# Patient Record
Sex: Female | Born: 1960 | Race: Black or African American | Hispanic: No | Marital: Married | Smoking: Former smoker
Health system: Southern US, Community
[De-identification: ages and names within clinical notes are randomized; demographics above are authoritative.]

## PROBLEM LIST (undated history)

## (undated) DIAGNOSIS — E7800 Pure hypercholesterolemia, unspecified: Secondary | ICD-10-CM

## (undated) DIAGNOSIS — E119 Type 2 diabetes mellitus without complications: Secondary | ICD-10-CM

## (undated) DIAGNOSIS — I10 Essential (primary) hypertension: Secondary | ICD-10-CM

## (undated) HISTORY — PX: TUBAL LIGATION: SHX77

## (undated) HISTORY — PX: ELBOW SURGERY: SHX618

## (undated) HISTORY — PX: ABLATION ON ENDOMETRIOSIS: SHX5787

---

## 1998-11-25 ENCOUNTER — Ambulatory Visit (HOSPITAL_BASED_OUTPATIENT_CLINIC_OR_DEPARTMENT_OTHER): Admission: RE | Admit: 1998-11-25 | Discharge: 1998-11-25 | Payer: Self-pay | Admitting: Orthopedic Surgery

## 2000-11-23 ENCOUNTER — Ambulatory Visit (HOSPITAL_BASED_OUTPATIENT_CLINIC_OR_DEPARTMENT_OTHER): Admission: RE | Admit: 2000-11-23 | Discharge: 2000-11-23 | Payer: Self-pay | Admitting: Orthopedic Surgery

## 2002-12-04 ENCOUNTER — Encounter: Payer: Self-pay | Admitting: Emergency Medicine

## 2002-12-04 ENCOUNTER — Emergency Department (HOSPITAL_COMMUNITY): Admission: EM | Admit: 2002-12-04 | Discharge: 2002-12-04 | Payer: Self-pay

## 2008-02-04 ENCOUNTER — Emergency Department (HOSPITAL_BASED_OUTPATIENT_CLINIC_OR_DEPARTMENT_OTHER): Admission: EM | Admit: 2008-02-04 | Discharge: 2008-02-04 | Payer: Self-pay | Admitting: Emergency Medicine

## 2008-02-04 ENCOUNTER — Ambulatory Visit: Payer: Self-pay | Admitting: Diagnostic Radiology

## 2008-04-12 ENCOUNTER — Emergency Department (HOSPITAL_BASED_OUTPATIENT_CLINIC_OR_DEPARTMENT_OTHER): Admission: EM | Admit: 2008-04-12 | Discharge: 2008-04-12 | Payer: Self-pay | Admitting: Emergency Medicine

## 2008-04-12 ENCOUNTER — Ambulatory Visit: Payer: Self-pay | Admitting: Radiology

## 2009-07-13 ENCOUNTER — Emergency Department (HOSPITAL_BASED_OUTPATIENT_CLINIC_OR_DEPARTMENT_OTHER): Admission: EM | Admit: 2009-07-13 | Discharge: 2009-07-13 | Payer: Self-pay | Admitting: Emergency Medicine

## 2009-07-13 ENCOUNTER — Ambulatory Visit: Payer: Self-pay | Admitting: Diagnostic Radiology

## 2010-05-17 LAB — BASIC METABOLIC PANEL
BUN: 14 mg/dL (ref 6–23)
CO2: 29 mEq/L (ref 19–32)
Chloride: 99 mEq/L (ref 96–112)
Creatinine, Ser: 0.8 mg/dL (ref 0.4–1.2)

## 2010-05-17 LAB — DIFFERENTIAL
Basophils Relative: 1 % (ref 0–1)
Eosinophils Absolute: 0 10*3/uL (ref 0.0–0.7)
Neutrophils Relative %: 75 % (ref 43–77)

## 2010-05-17 LAB — D-DIMER, QUANTITATIVE: D-Dimer, Quant: 0.36 ug/mL-FEU (ref 0.00–0.48)

## 2010-05-17 LAB — CBC
MCHC: 32.2 g/dL (ref 30.0–36.0)
MCV: 80.5 fL (ref 78.0–100.0)
Platelets: 268 10*3/uL (ref 150–400)

## 2010-05-17 LAB — POCT CARDIAC MARKERS

## 2010-07-16 NOTE — Op Note (Signed)
[place]. [facility]  Patient:    Gwendolyn Wilson, Gwendolyn Wilson Visit Number: [id number] MRN: [id number]          Service Type: DSU Location: [facility] Attending Physician:  Ronne Binning Dictated by:   Nicki Reaper, M.D. Proc. Date: 11/23/00 Admit Date:  11/23/2000                             Operative Report  PREOPERATIVE DIAGNOSIS: Stenosing tenosynovitis, right middle finger.  POSTOPERATIVE DIAGNOSIS: Stenosing tenosynovitis, right middle finger.  OPERATION/PROCEDURE: Release of A1 pulley, right middle finger.  SURGEON: Nicki Reaper, M.D.  ASSISTANT: Joaquin Courts, R.N.  ANESTHESIA: Forearm-based IV regional.  ANESTHESIOLOGIST: Halford Decamp, M.D.  INDICATIONS FOR PROCEDURE: The patient is a 50 year old female, with a history of triggering of her right middle finger, not responsive to conservative treatment.  DESCRIPTION OF PROCEDURE: The patient was brought to the operating room, where a forearm-based IV regional anesthetic was carried out without difficulty. She was prepped and draped using Betadine scrubbing solution with the right arm free.  An oblique incision was made over the metacarpophalangeal joint of the right middle finger and carried down through the subcutaneous tissue. Bleeders were electrocauterized.  The fascia was split and the A1 pulley was identified, with protection to the neurovascular structures.  The A1 pulley was then incised on its radial aspect.  This was found to be markedly thickened.  An incision was made in the very beginning of the A2 pulley and the finger placed through full range of motion, and no further triggering was identified.  The wound was irrigated.  The skin was closed with interrupted 5-0 nylon sutures.  A sterile compressive dressing was applied.  The patient tolerated the procedure well and was taken to the recovery room for observation in satisfactory condition.  She is discharged home to return to the  [facility] of [place] in one week, on Vicodin and Keflex.Dictated by: Nicki Reaper, M.D. Attending Physician:  Ronne Binning DD:  11/23/00 TD:  11/23/00 Job: [id number]

## 2016-04-15 ENCOUNTER — Emergency Department (HOSPITAL_BASED_OUTPATIENT_CLINIC_OR_DEPARTMENT_OTHER)
Admission: EM | Admit: 2016-04-15 | Discharge: 2016-04-15 | Disposition: A | Discharge status: Home / Self Care | Payer: No Typology Code available for payment source | Attending: Emergency Medicine | Admitting: Emergency Medicine

## 2016-04-15 ENCOUNTER — Emergency Department (HOSPITAL_BASED_OUTPATIENT_CLINIC_OR_DEPARTMENT_OTHER): Payer: No Typology Code available for payment source

## 2016-04-15 ENCOUNTER — Encounter (HOSPITAL_BASED_OUTPATIENT_CLINIC_OR_DEPARTMENT_OTHER): Payer: Self-pay

## 2016-04-15 DIAGNOSIS — Z87891 Personal history of nicotine dependence: Secondary | ICD-10-CM | POA: Insufficient documentation

## 2016-04-15 DIAGNOSIS — R0789 Other chest pain: Secondary | ICD-10-CM | POA: Insufficient documentation

## 2016-04-15 DIAGNOSIS — R079 Chest pain, unspecified: Secondary | ICD-10-CM | POA: Diagnosis present

## 2016-04-15 DIAGNOSIS — I10 Essential (primary) hypertension: Secondary | ICD-10-CM | POA: Diagnosis not present

## 2016-04-15 DIAGNOSIS — R062 Wheezing: Secondary | ICD-10-CM | POA: Diagnosis not present

## 2016-04-15 DIAGNOSIS — E119 Type 2 diabetes mellitus without complications: Secondary | ICD-10-CM | POA: Diagnosis not present

## 2016-04-15 HISTORY — DX: Pure hypercholesterolemia, unspecified: E78.00

## 2016-04-15 HISTORY — DX: Type 2 diabetes mellitus without complications: E11.9

## 2016-04-15 HISTORY — DX: Essential (primary) hypertension: I10

## 2016-04-15 LAB — CBC WITH DIFFERENTIAL/PLATELET
Basophils Absolute: 0 10*3/uL (ref 0.0–0.1)
Basophils Relative: 0 %
Eosinophils Absolute: 0.1 10*3/uL (ref 0.0–0.7)
Eosinophils Relative: 2 %
HEMATOCRIT: 38.2 % (ref 36.0–46.0)
HEMOGLOBIN: 12.1 g/dL (ref 12.0–15.0)
LYMPHS ABS: 1.8 10*3/uL (ref 0.7–4.0)
LYMPHS PCT: 26 %
MCH: 25.4 pg — AB (ref 26.0–34.0)
MCHC: 31.7 g/dL (ref 30.0–36.0)
MCV: 80.1 fL (ref 78.0–100.0)
Monocytes Absolute: 0.4 10*3/uL (ref 0.1–1.0)
Monocytes Relative: 6 %
NEUTROS PCT: 66 %
Neutro Abs: 4.4 10*3/uL (ref 1.7–7.7)
Platelets: 242 10*3/uL (ref 150–400)
RBC: 4.77 MIL/uL (ref 3.87–5.11)
RDW: 15.2 % (ref 11.5–15.5)
WBC: 6.7 10*3/uL (ref 4.0–10.5)

## 2016-04-15 LAB — COMPREHENSIVE METABOLIC PANEL
ALBUMIN: 3.7 g/dL (ref 3.5–5.0)
ALT: 18 U/L (ref 14–54)
ANION GAP: 8 (ref 5–15)
AST: 23 U/L (ref 15–41)
Alkaline Phosphatase: 103 U/L (ref 38–126)
BUN: 9 mg/dL (ref 6–20)
CHLORIDE: 101 mmol/L (ref 101–111)
CO2: 28 mmol/L (ref 22–32)
Calcium: 9.5 mg/dL (ref 8.9–10.3)
Creatinine, Ser: 0.69 mg/dL (ref 0.44–1.00)
GFR calc non Af Amer: >60 mL/min (ref >60)
Glucose, Bld: 171 mg/dL — ABNORMAL HIGH (ref 65–99)
Potassium: 2.9 mmol/L — ABNORMAL LOW (ref 3.5–5.1)
SODIUM: 137 mmol/L (ref 135–145)
Total Bilirubin: 0.5 mg/dL (ref 0.3–1.2)
Total Protein: 7.6 g/dL (ref 6.5–8.1)

## 2016-04-15 LAB — TROPONIN I: Troponin I: <0.03 ng/mL (ref <0.03)

## 2016-04-15 MED ORDER — KETOROLAC TROMETHAMINE 30 MG/ML IJ SOLN
30.0000 mg | Freq: Once | INTRAMUSCULAR | Status: AC
  Administered 2016-04-15: 30 mg via INTRAVENOUS
  Filled 2016-04-15: qty 1

## 2016-04-15 NOTE — Discharge Instructions (Signed)
Ibuprofen 600 mg every 6 hours as needed for pain.  Follow-up with cardiology next week. Call Monday to make arrangements for a follow-up appointment. [facility] has been provided in this discharge summary for you to make these arrangements.  Return to the ER in the meantime if your symptoms significantly worsen or change.

## 2016-04-15 NOTE — ED Provider Notes (Signed)
[facility] Provider Note   CSN: [id number] Arrival date & time: 04/15/16  2045  By signing my name below, I, [name], attest that this documentation has been prepared under the direction and in the presence of Geoffery Lyonsouglas Jahmia Berrett, MD. Electronically Signed: [name], Scribe. 04/15/2016. 9:01 PM.  History   Chief Complaint Chief Complaint  Patient presents with  . Chest Pain   The history is provided by the patient. No language interpreter was used.  Chest Pain   This is a new problem. The current episode started more than 2 days ago. The problem occurs constantly. The problem has not changed since onset.Associated symptoms include diaphoresis. Pertinent negatives include no nausea.   HPI Comments: Gwendolyn DadeClaudia Wilson is a 56 y.o. female with a PMHx of DM, hyperlipidemia, and HTN who presents to the Emergency Department complaining of intermittent moderate chest pain that started about 3 days ago. She states was having URI-like symptoms 2 weeks ago and she had mild chest pain then. Her pain returned and worsened today. She tried using her albuterol inhaler today without any relief. No exacerbating factors. She describes the pain as a pressure sensation. She reports associated wheezing and diaphoresis. She has a family hx of cardiac issues. She denies any hx of smoking, nausea, or other complaints.   Past Medical History:  Diagnosis Date  . Diabetes mellitus without complication (HCC)   . High cholesterol   . Hypertension     There are no active problems to display for this patient.   Past Surgical History:  Procedure Laterality Date  . ABLATION ON ENDOMETRIOSIS    . ELBOW SURGERY    . TUBAL LIGATION      OB History    No data available       Home Medications    Prior to Admission medications   Not on File    Family History No family history on file.  Social History Social History  Substance Use Topics  . Smoking status: Former Games developermoker  . Smokeless  tobacco: Never Used  . Alcohol use No     Allergies   Patient has no known allergies.   Review of Systems Review of Systems  Constitutional: Positive for diaphoresis.  Respiratory: Positive for wheezing.   Cardiovascular: Positive for chest pain.  Gastrointestinal: Negative for nausea.  All other systems reviewed and are negative.    Physical Exam Updated Vital Signs BP 151/93 (BP Location: Left Arm)   Pulse 80   Temp 98.1 F (36.7 C) (Oral)   Resp 18   Ht 5\' 10"  (1.778 m)   Wt 279 lb (126.6 kg)   SpO2 99%   BMI 40.03 kg/m   Physical Exam  Constitutional: She is oriented to person, place, and time. She appears well-developed and well-nourished. No distress.  HENT:  Head: Normocephalic and atraumatic.  Eyes: EOM are normal.  Neck: Normal range of motion.  Cardiovascular: Normal rate, regular rhythm and normal heart sounds.   Pulmonary/Chest: Effort normal and breath sounds normal.  Abdominal: Soft. She exhibits no distension. There is no tenderness.  Musculoskeletal: Normal range of motion.  Neurological: She is alert and oriented to person, place, and time.  Skin: Skin is warm and dry.  Psychiatric: She has a normal mood and affect. Judgment normal.  Nursing note and vitals reviewed.    ED Treatments / Results  DIAGNOSTIC STUDIES: Oxygen Saturation is 99% on RA, normal by my interpretation.    COORDINATION OF CARE: 9:05 PM- Pt  advised of plan for treatment and pt agrees.  Labs (all labs ordered are listed, but only abnormal results are displayed) Labs Reviewed  COMPREHENSIVE METABOLIC PANEL  TROPONIN I  CBC WITH DIFFERENTIAL/PLATELET    EKG  EKG Interpretation None       Radiology No results found.  Procedures Procedures (including critical care time)  Medications Ordered in ED Medications  ketorolac (TORADOL) 30 MG/ML injection 30 mg (not administered)     Initial Impression / Assessment and Plan / ED Course  I have reviewed the  triage vital signs and the nursing notes.  Pertinent labs & imaging results that were available during my care of the patient were reviewed by me and considered in my medical decision making (see chart for details).  Patient presents here with complaints of discomfort in her chest. She has recently been treated for bronchitis. Her chest discomfort has been ongoing for 3 days and her troponin is negative and EKG unchanged. I doubt a cardiac etiology. She does have significant risk factors however and I feel as though a follow-up appointment with cardiology as indicated. She will be given the information for the [facility] cardiology clinic and advised to follow-up next week.  Final Clinical Impressions(s) / ED Diagnoses   Final diagnoses:  None    New Prescriptions New Prescriptions   No medications on file   I personally performed the services described in this documentation, which was scribed in my presence. The recorded information has been reviewed and is accurate.        Geoffery Lyons, MD 04/15/16 2252

## 2016-04-15 NOTE — ED Triage Notes (Signed)
C/o cough 2 weeks ago-dx with bronchitis-CP x 3 days-using inhaler with no relief-NAD-steady gait

## 2021-08-01 ENCOUNTER — Emergency Department (HOSPITAL_BASED_OUTPATIENT_CLINIC_OR_DEPARTMENT_OTHER)
Admission: EM | Admit: 2021-08-01 | Discharge: 2021-08-01 | Disposition: A | Discharge status: Home / Self Care | Payer: No Typology Code available for payment source | Attending: Emergency Medicine | Admitting: Emergency Medicine

## 2021-08-01 ENCOUNTER — Emergency Department (HOSPITAL_BASED_OUTPATIENT_CLINIC_OR_DEPARTMENT_OTHER): Payer: No Typology Code available for payment source

## 2021-08-01 ENCOUNTER — Other Ambulatory Visit: Payer: Self-pay

## 2021-08-01 ENCOUNTER — Encounter (HOSPITAL_BASED_OUTPATIENT_CLINIC_OR_DEPARTMENT_OTHER): Payer: Self-pay | Admitting: Emergency Medicine

## 2021-08-01 DIAGNOSIS — E119 Type 2 diabetes mellitus without complications: Secondary | ICD-10-CM | POA: Diagnosis not present

## 2021-08-01 DIAGNOSIS — I10 Essential (primary) hypertension: Secondary | ICD-10-CM | POA: Diagnosis not present

## 2021-08-01 DIAGNOSIS — R55 Syncope and collapse: Secondary | ICD-10-CM | POA: Insufficient documentation

## 2021-08-01 LAB — BASIC METABOLIC PANEL
Anion gap: 8 (ref 5–15)
BUN: 9 mg/dL (ref 6–20)
CO2: 28 mmol/L (ref 22–32)
Calcium: 9.5 mg/dL (ref 8.9–10.3)
Chloride: 103 mmol/L (ref 98–111)
Creatinine, Ser: 0.67 mg/dL (ref 0.44–1.00)
GFR, Estimated: >60 mL/min (ref >60)
Glucose, Bld: 95 mg/dL (ref 70–99)
Potassium: 3.5 mmol/L (ref 3.5–5.1)
Sodium: 139 mmol/L (ref 135–145)

## 2021-08-01 LAB — CBC WITH DIFFERENTIAL/PLATELET
Abs Immature Granulocytes: 0.02 10*3/uL (ref 0.00–0.07)
Basophils Absolute: 0 10*3/uL (ref 0.0–0.1)
Basophils Relative: 1 %
Eosinophils Absolute: 0.1 10*3/uL (ref 0.0–0.5)
Eosinophils Relative: 1 %
HCT: 40 % (ref 36.0–46.0)
Hemoglobin: 12.7 g/dL (ref 12.0–15.0)
Immature Granulocytes: 0 %
Lymphocytes Relative: 31 %
Lymphs Abs: 2 10*3/uL (ref 0.7–4.0)
MCH: 25.6 pg — ABNORMAL LOW (ref 26.0–34.0)
MCHC: 31.8 g/dL (ref 30.0–36.0)
MCV: 80.6 fL (ref 80.0–100.0)
Monocytes Absolute: 0.5 10*3/uL (ref 0.1–1.0)
Monocytes Relative: 7 %
Neutro Abs: 3.9 10*3/uL (ref 1.7–7.7)
Neutrophils Relative %: 60 %
Platelets: 275 10*3/uL (ref 150–400)
RBC: 4.96 MIL/uL (ref 3.87–5.11)
RDW: 14.6 % (ref 11.5–15.5)
WBC: 6.5 10*3/uL (ref 4.0–10.5)
nRBC: 0 % (ref 0.0–0.2)

## 2021-08-01 LAB — TROPONIN I (HIGH SENSITIVITY)
Troponin I (High Sensitivity): 6 ng/L (ref <18)
Troponin I (High Sensitivity): 6 ng/L (ref <18)

## 2021-08-01 MED ORDER — ACETAMINOPHEN 325 MG PO TABS
650.0000 mg | ORAL_TABLET | Freq: Once | ORAL | Status: AC
  Administered 2021-08-01: 650 mg via ORAL
  Filled 2021-08-01: qty 2

## 2021-08-01 NOTE — ED Provider Notes (Signed)
[facility] EMERGENCY DEPARTMENT Provider Note   CSN: [id number] Arrival date & time: 08/01/21  1421     History  Chief Complaint  Patient presents with   Gwendolyn Wilson    [name] is a 61 y.o. female with past medical history significant for DM, HTN, hyperlipidemia here for evaluation of syncope. States she was at church earlier today. Felt hot, lightheaded and the next thing she knew she was on the floor. Friend states possible LOC. Hit head on the chair. No seizure-like activity, bowel or bladder incontinence, tongue biting.  Denies sudden onset headache, numbness, weakness.  Friend said she acted like she was short of breath after she was singing loudly prior to syncopal episode. Denies SOB. No history of syncope.  No back pain, sudden onset chest pain, palpitations.  No history of PE, DVT.  No recent surgery, mobilization or malignancy. In normal state of health prior to syncope.  HPI     Home Medications Prior to Admission medications   Not on File      Allergies    Atorvastatin    Review of Systems   Review of Systems  Constitutional: Negative.   HENT: Negative.    Respiratory:  Positive for shortness of breath. Negative for apnea, cough, choking, chest tightness, wheezing and stridor.   Cardiovascular: Negative.   Gastrointestinal: Negative.   Genitourinary: Negative.   Musculoskeletal:  Positive for neck pain (posterior neckpain).  Skin: Negative.   Neurological: Negative.   All other systems reviewed and are negative.  Physical Exam Updated Vital Signs BP 139/77   Pulse 75   Temp 97.7 F (36.5 C) (Oral)   Resp 15   Ht  (1.778 m)   Wt 113.4 kg   SpO2 96%   BMI 35.87 kg/m  Physical Exam Physical Exam  Constitutional: Pt is oriented to person, place, and time. Pt appears well-developed and well-nourished. No distress.  HENT:  Head: Normocephalic and atraumatic.  Mouth/Throat: Oropharynx is clear and moist.  Eyes: Conjunctivae and EOM are  normal.  No nystagmus  Neck: Normal range of motion. Neck supple.  Full active and passive ROM without pain Mild posterior tenderness. No nuchal rigidity or meningeal signs  Cardiovascular: Normal rate, regular rhythm and intact distal pulses.   Pulmonary/Chest: Effort normal and breath sounds normal. No respiratory distress. Pt has no wheezes. No rales.  Abdominal: Soft. Bowel sounds are normal. There is no tenderness. There is no rebound and no guarding.  Musculoskeletal: Normal range of motion.  Compartment soft, Homans' sign negative, nontender bilateral upper and lower extremities Lymphadenopathy:    No cervical adenopathy.  Neurological: Pt. is alert and oriented to person, place, and time. He has normal reflexes. No cranial nerve deficit.  Exhibits normal muscle tone. Coordination normal.  Mental Status:  Alert, oriented, thought content appropriate. Speech fluent without evidence of aphasia. Able to follow 2 step commands without difficulty.  Cn 2-12 grossly intact Negative Romberg, No pronator drift, ambulatory without ataxia. Neg finger to nose Skin: Skin is warm and dry. No rash noted. Pt is not diaphoretic.  Psychiatric: Pt has a normal mood and affect. Behavior is normal. Judgment and thought content normal.  Nursing note and vitals reviewed.  ED Results / Procedures / Treatments   Labs (all labs ordered are listed, but only abnormal results are displayed) Labs Reviewed  CBC WITH DIFFERENTIAL/PLATELET - Abnormal; Notable for the following components:      Result Value   MCH 25.6 (*)  All other components within normal limits  BASIC METABOLIC PANEL  TROPONIN I (HIGH SENSITIVITY)  TROPONIN I (HIGH SENSITIVITY)    EKG None  Radiology DG Chest 2 View  Result Date: 08/01/2021 CLINICAL DATA:  Syncope EXAM: CHEST - 2 VIEW COMPARISON:  04/15/2016 FINDINGS: The heart size and mediastinal contours are within normal limits. No focal airspace consolidation, pleural effusion,  or pneumothorax. The visualized skeletal structures are unremarkable. IMPRESSION: No active cardiopulmonary disease. Electronically Signed   By: Duanne Guess D.O.   On: 08/01/2021 17:10   CT Head Wo Contrast  Result Date: 08/01/2021 CLINICAL DATA:  Head trauma, moderate-severe; Neck trauma, impaired ROM (Age 22-64y). Pt c/o fall at church with head injury. Pt c/o pain to right side of head radiating down to neck. Pt unsure if she had positive LOC. EXAM: CT HEAD WITHOUT CONTRAST CT CERVICAL SPINE WITHOUT CONTRAST TECHNIQUE: Multidetector CT imaging of the head and cervical spine was performed following the standard protocol without intravenous contrast. Multiplanar CT image reconstructions of the cervical spine were also generated. RADIATION DOSE REDUCTION: This exam was performed according to the departmental dose-optimization program which includes automated exposure control, adjustment of the mA and/or kV according to patient size and/or use of iterative reconstruction technique. COMPARISON:  None Available. FINDINGS: CT HEAD FINDINGS Brain: No evidence of large-territorial acute infarction. No parenchymal hemorrhage. No mass lesion. No extra-axial collection. No mass effect or midline shift. No hydrocephalus. Basilar cisterns are patent. Vascular: No hyperdense vessel. Skull: No acute fracture or focal lesion. Sinuses/Orbits: Paranasal sinuses and mastoid air cells are clear. The orbits are unremarkable. Other: None. CT CERVICAL SPINE FINDINGS Alignment: Normal. Skull base and vertebrae: No acute fracture. No aggressive appearing focal osseous lesion or focal pathologic process. Soft tissues and spinal canal: No prevertebral fluid or swelling. No visible canal hematoma. Upper chest: Unremarkable. Other: None. IMPRESSION: 1. No acute intracranial abnormality. 2. No acute displaced fracture or traumatic listhesis of the cervical spine. Electronically Signed   By: Tish Frederickson M.D.   On: 08/01/2021 15:15    CT Cervical Spine Wo Contrast  Result Date: 08/01/2021 CLINICAL DATA:  Head trauma, moderate-severe; Neck trauma, impaired ROM (Age 22-64y). Pt c/o fall at church with head injury. Pt c/o pain to right side of head radiating down to neck. Pt unsure if she had positive LOC. EXAM: CT HEAD WITHOUT CONTRAST CT CERVICAL SPINE WITHOUT CONTRAST TECHNIQUE: Multidetector CT imaging of the head and cervical spine was performed following the standard protocol without intravenous contrast. Multiplanar CT image reconstructions of the cervical spine were also generated. RADIATION DOSE REDUCTION: This exam was performed according to the departmental dose-optimization program which includes automated exposure control, adjustment of the mA and/or kV according to patient size and/or use of iterative reconstruction technique. COMPARISON:  None Available. FINDINGS: CT HEAD FINDINGS Brain: No evidence of large-territorial acute infarction. No parenchymal hemorrhage. No mass lesion. No extra-axial collection. No mass effect or midline shift. No hydrocephalus. Basilar cisterns are patent. Vascular: No hyperdense vessel. Skull: No acute fracture or focal lesion. Sinuses/Orbits: Paranasal sinuses and mastoid air cells are clear. The orbits are unremarkable. Other: None. CT CERVICAL SPINE FINDINGS Alignment: Normal. Skull base and vertebrae: No acute fracture. No aggressive appearing focal osseous lesion or focal pathologic process. Soft tissues and spinal canal: No prevertebral fluid or swelling. No visible canal hematoma. Upper chest: Unremarkable. Other: None. IMPRESSION: 1. No acute intracranial abnormality. 2. No acute displaced fracture or traumatic listhesis of the cervical spine. Electronically Signed  By: Tish FredericksonMorgane  Naveau M.D.   On: 08/01/2021 15:15    Procedures Procedures    Medications Ordered in ED Medications  acetaminophen (TYLENOL) tablet 650 mg (650 mg Oral Given 08/01/21 1708)    ED Course/ Medical Decision  Making/ A&P    [age] year old history of diabetes, hypertension, hyperlipidemia here for evaluation of syncopal episode.  He was standing at church, apparently singing loudly, hot, lightheaded had a syncopal episode.  Hit right side of head.  No seizure-like activity.  Symptoms occurred 3 hours PTA.  Friend said patient seemed short of breath however patient denies current chest pain, shortness of breath.  She is no clinical evidence of VTE on exam.  She is Wells criteria low risk.  She is able to do her normal ADLs without any dyspnea on exertion or chest pain.  No prior history of syncope.  No history of arrhythmia, sudden cardiac death in family members.  No sudden onset headache, numbness or weakness.  Plan on labs, imaging and reassess  Labs and imaging personally viewed and interpreted:  CT head without acute abnormality CT cervical without acute abnormality DG chest without significant abnormality CBC without leukocytosis, metabolic panel without significant abnormality Delta troponin flat EKG without ischemic changes, will not crossover into computer  Patient reassessed.  She has been here for 5 hours, has been asymptomatic duration of her ED stay.  At this time I have low suspicion for arrhythmia, PE, ACS, CVA, dissection, infectious process as cause of her earlier syncope.  She has no evidence of traumatic injuries on exam from her fall.  She has had no seizure-like activity.  She is nonfocal neuro exam without deficit.  Do not feel patient needs additional labs, imaging, hospitalization at this time.  Patient without arrhythmia or tachycardia while here in the department.  Patient without history of congestive heart failure, normal hematocrit, normal ECG, no shortness of breath and systolic blood pressure greater than 90; patient is low risk. Will plan for discharge home with close cardiology/PCP follow-up.  Possibility of recurrent syncope has been discussed. I discussed reasons to avoid  driving until cardiology/PCP followup and other safety prevention including use of ladders and working at heights.   The patient has been appropriately medically screened and/or stabilized in the ED. I have low suspicion for any other emergent medical condition which would require further screening, evaluation or treatment in the ED or require inpatient management.  Patient is hemodynamically stable and in no acute distress.  Patient able to ambulate in department prior to ED.  Evaluation does not show acute pathology that would require ongoing or additional emergent interventions while in the emergency department or further inpatient treatment.  I have discussed the diagnosis with the patient and answered all questions.  Pain is been managed while in the emergency department and patient has no further complaints prior to discharge.  Patient is comfortable with plan discussed in room and is stable for discharge at this time.  I have discussed strict return precautions for returning to the emergency department.  Patient was encouraged to follow-up with PCP/specialist refer to at discharge.                            Medical Decision Making Amount and/or Complexity of Data Reviewed Independent Historian: friend External Data Reviewed: labs, radiology, ECG and notes. Labs: ordered. Decision-making details documented in ED Course. Radiology: ordered and independent interpretation performed. Decision-making details documented in ED  Course. ECG/medicine tests: ordered and independent interpretation performed. Decision-making details documented in ED Course.  Risk OTC drugs. Prescription drug management. Drug therapy requiring intensive monitoring for toxicity. Decision regarding hospitalization. Diagnosis or treatment significantly limited by social determinants of health.         Final Clinical Impression(s) / ED Diagnoses Final diagnoses:  Syncope, unspecified syncope type    Rx / DC  Orders ED Discharge Orders     None         Caprice Mccaffrey A, PA-C 08/01/21 1927    Franne Forts, DO 08/01/21 2319

## 2021-08-01 NOTE — Discharge Instructions (Addendum)
It was a pleasure taking care of you here in the emergency department today.    You may take Tylenol as needed for any headaches you may develop.  Make sure to rest, drink plenty of fluids.  Let your primary care doctor know you are seen here in the emergency department today may follow-up with you in a few days  Return for new or worsening symptoms

## 2021-08-01 NOTE — ED Triage Notes (Signed)
Pt c/o fall at church with head injury. Pt c/o pain to right side of head radiating down to neck. Pt unsure if she had positive LOC.

## 2021-08-01 NOTE — ED Notes (Signed)
Pt reports that she was "shouting" at church this morning and passed out. Pt states that she hit her head and is having R arm pain after the fall. Pt ambulatory from wheelchair to stretcher with no issues.

## 2022-11-09 IMAGING — DX DG CHEST 2V
2 series · 2 of 2 positions shown · non-contrast
Comparison: 04/15/2016

CLINICAL DATA: Syncope

EXAM:
CHEST - 2 VIEW

[chest pa]
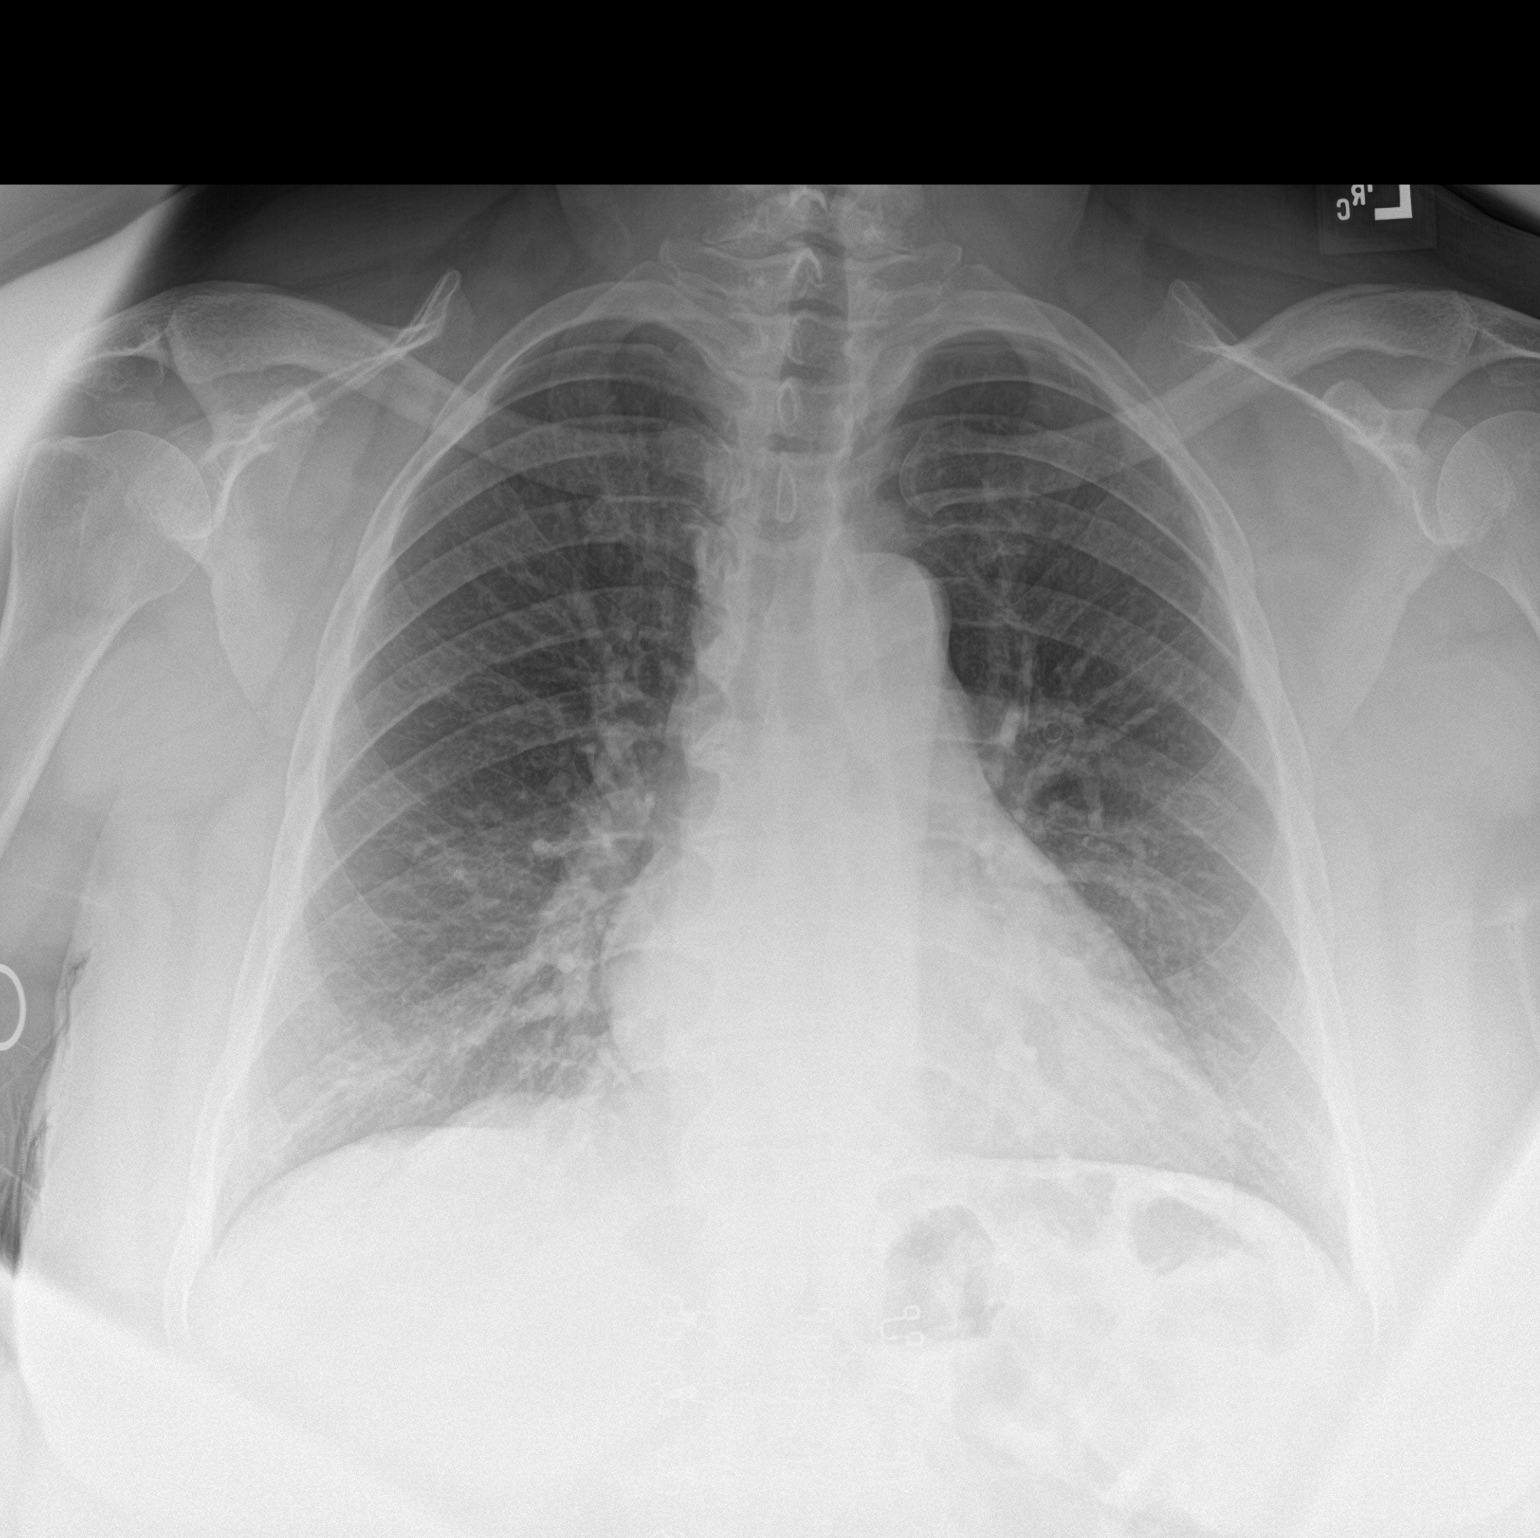

[chest lat]
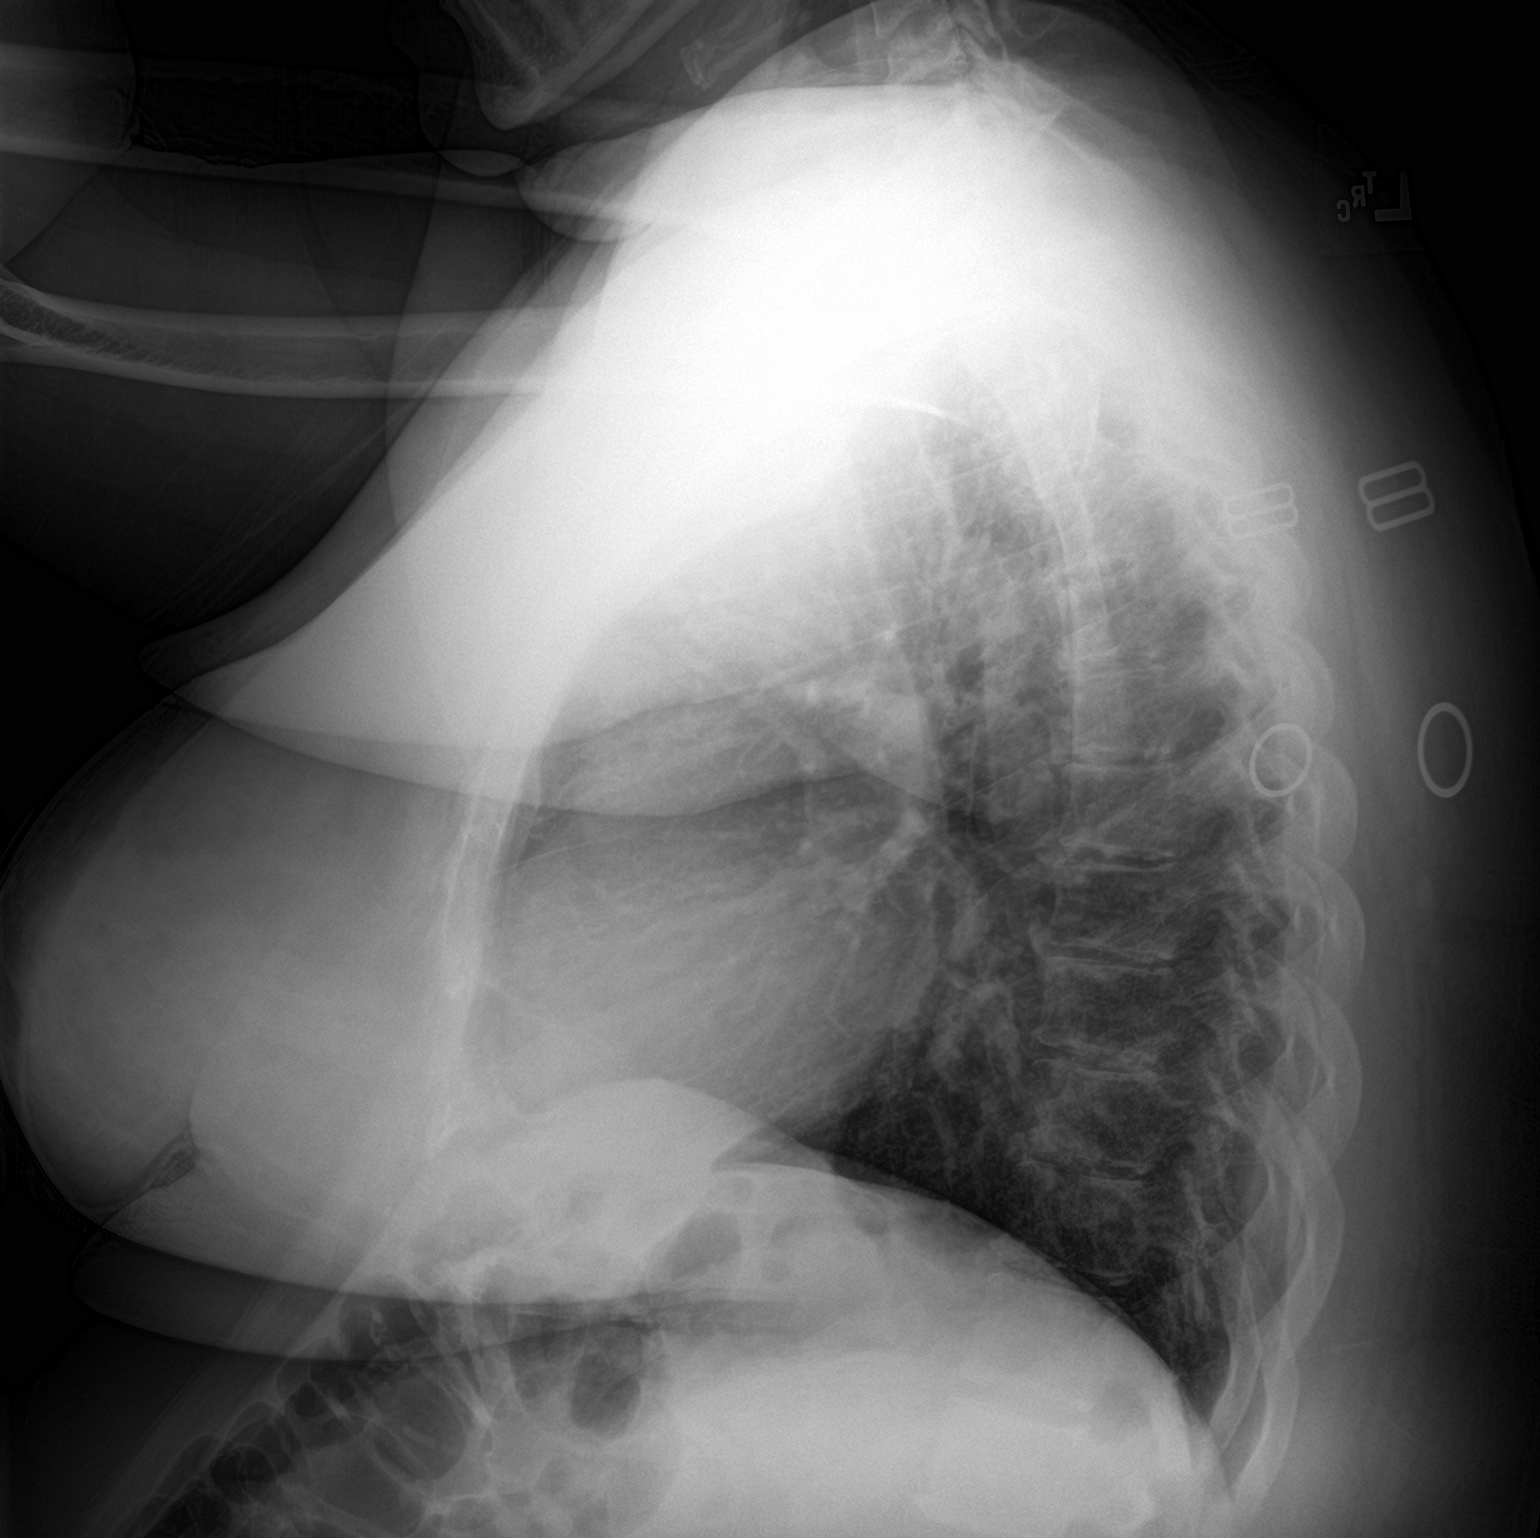

[2 of 2 positions shown; findings below may reference images not displayed]

FINDINGS: The heart size and mediastinal contours are within normal limits. No
focal airspace consolidation, pleural effusion, or pneumothorax. The
visualized skeletal structures are unremarkable.
IMPRESSION: No active cardiopulmonary disease.

## 2022-11-09 IMAGING — CT CT CERVICAL SPINE W/O CM
3 of 4 series · 13 of 33 positions shown, 16 images · non-contrast
Comparison: None Available.

CLINICAL DATA: Head trauma, moderate-severe; Neck trauma, impaired
ROM (Age 16-64y). Pt c/o fall at church with head injury. Pt c/o
pain to right side of head radiating down to neck. Pt unsure if she
had positive LOC.



[Series 3: c_spine 2.0 i30s 3 · axial · 0.30mm/px · z∈[-285,-185]mm · 5 of 76 slices shown, 7 images]
[im 13/76  soft-tissue]
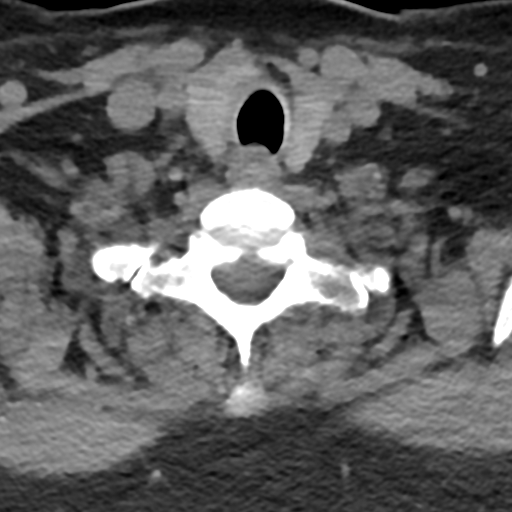
[im 13/76  bone]
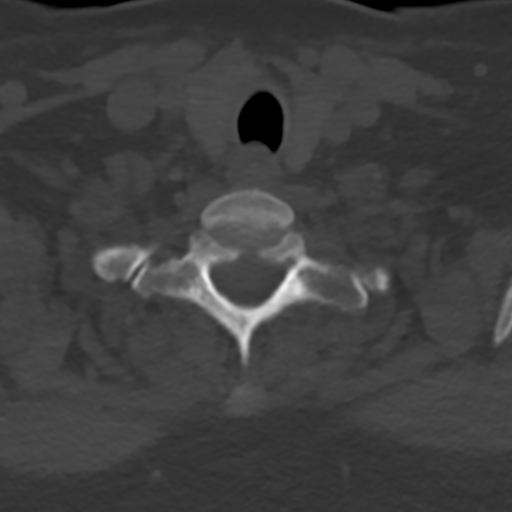
[im 26/76  bone]
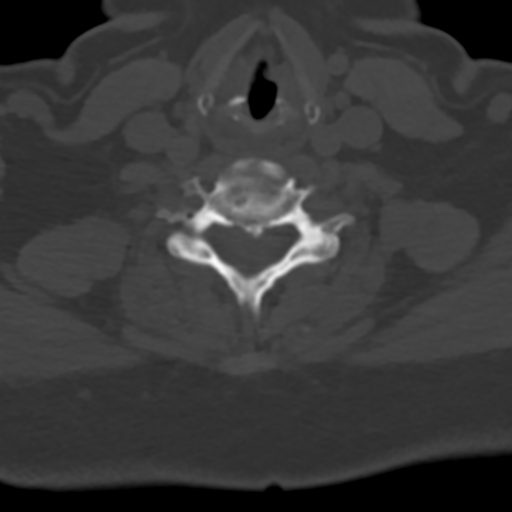
[im 38/76  bone]
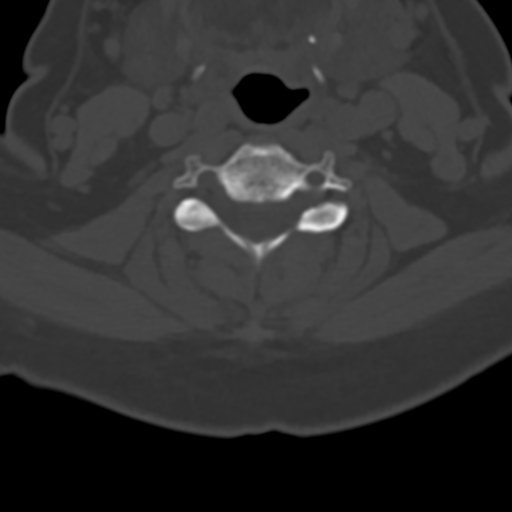
[im 51/76  bone]
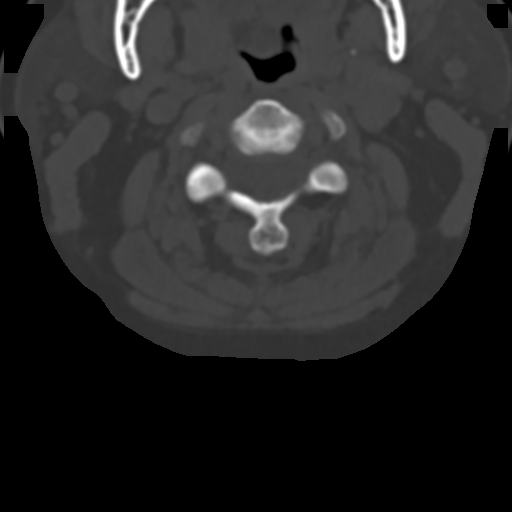
[im 63/76  soft-tissue]
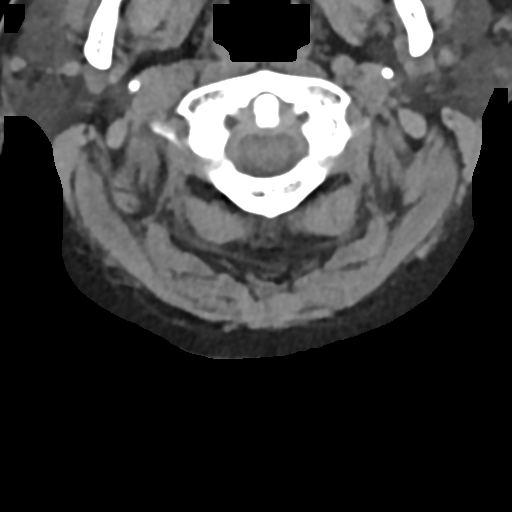
[im 63/76  bone]
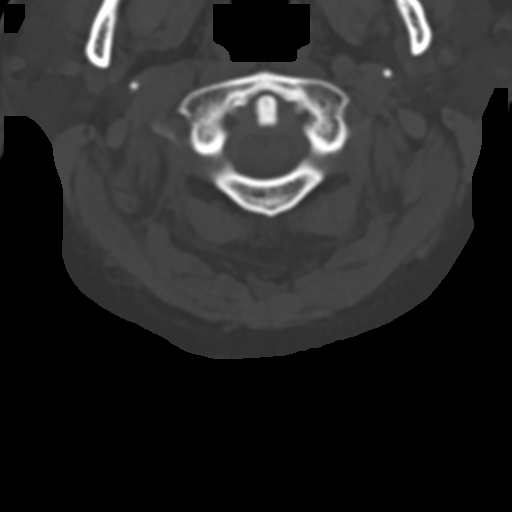

[Series 5: coronals · coronal · 0.23mm/px · 3 of 41 slices shown]
[im 9/41  bone]
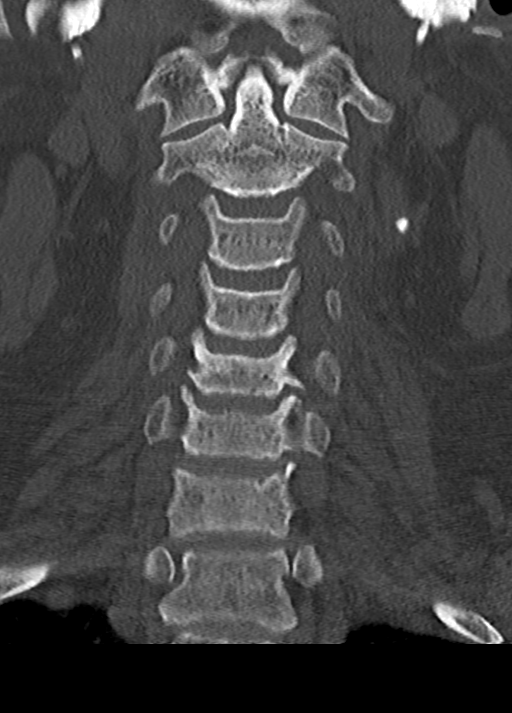
[im 17/41  bone]
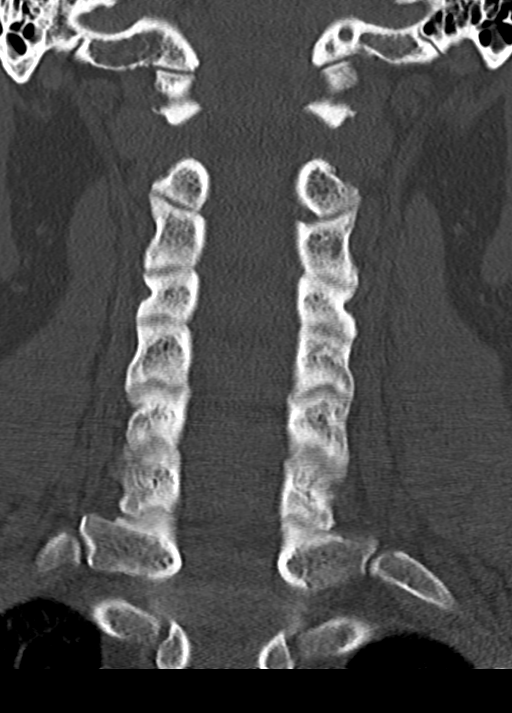
[im 25/41  bone]
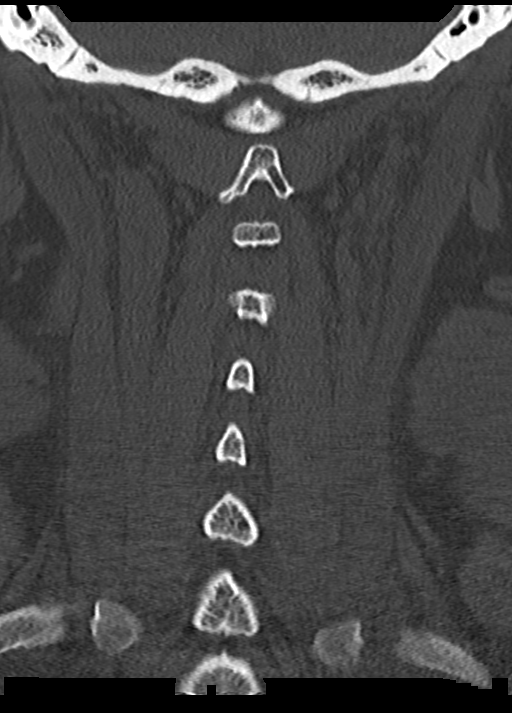

[Series 6: sagittals · sagittal · 0.29mm/px · 5 of 61 slices shown, 6 images]
[im 21/61  bone]
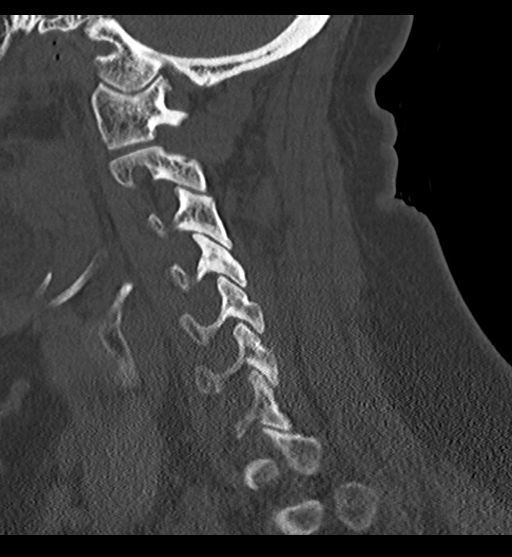
[im 26/61  bone]
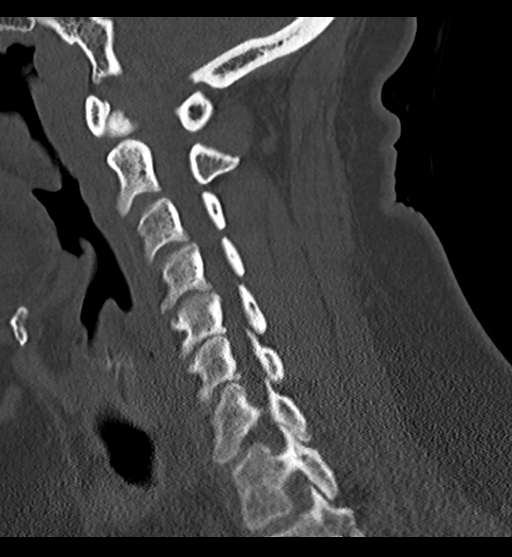
[im 31/61  soft-tissue]
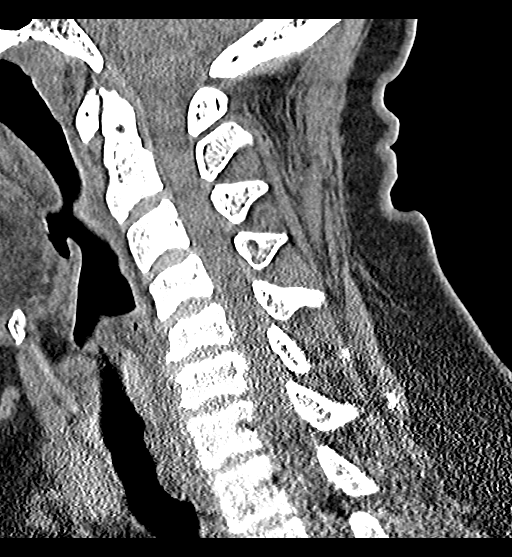
[im 31/61  bone]
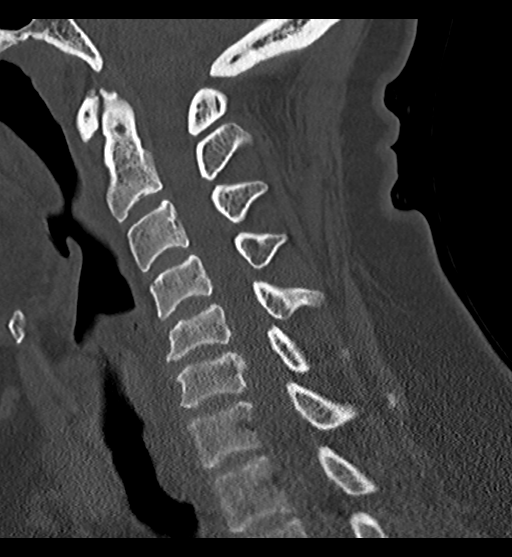
[im 36/61  bone]
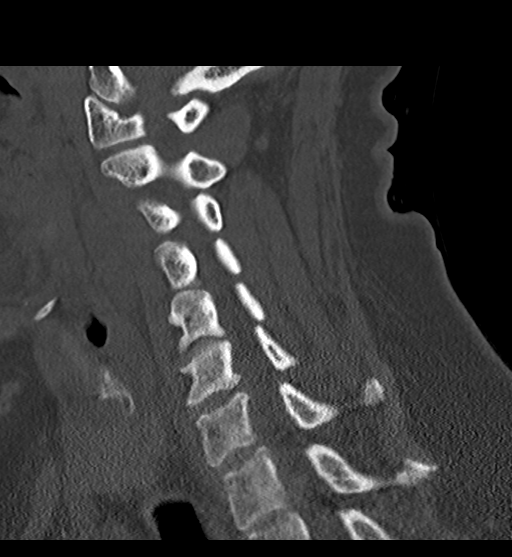
[im 41/61  bone]
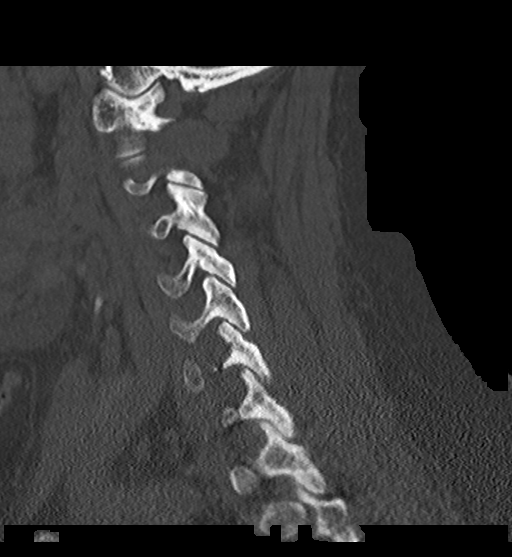

[13 of 33 positions shown; findings below may reference images not displayed]

FINDINGS: CT HEAD FINDINGS

Brain:

No evidence of large-territorial acute infarction. No parenchymal
hemorrhage. No mass lesion. No extra-axial collection.

No mass effect or midline shift. No hydrocephalus. Basilar cisterns
are patent.

Vascular: No hyperdense vessel.

Skull: No acute fracture or focal lesion.

Sinuses/Orbits: Paranasal sinuses and mastoid air cells are clear.
The orbits are unremarkable.

Other: None.

CT CERVICAL SPINE FINDINGS

Alignment: Normal.

Skull base and vertebrae: No acute fracture. No aggressive appearing
focal osseous lesion or focal pathologic process.

Soft tissues and spinal canal: No prevertebral fluid or swelling. No
visible canal hematoma.

Upper chest: Unremarkable.

Other: None.
IMPRESSION: 1. No acute intracranial abnormality.
2. No acute displaced fracture or traumatic listhesis of the
cervical spine.

## 2022-11-09 IMAGING — CT CT HEAD W/O CM
3 series · 14 of 47 positions shown, 16 images · non-contrast
Comparison: None Available.

CLINICAL DATA: Head trauma, moderate-severe; Neck trauma, impaired
ROM (Age [age]y). Pt c/o fall at church with head injury. Pt c/o
pain to right side of head radiating down to neck. Pt unsure if she
had positive LOC.



[Series 2: head 5.0 h30s · axial · 0.44mm/px · z∈[-179,-54]mm · 8 of 31 slices shown, 10 images]
[im 3/31  brain]
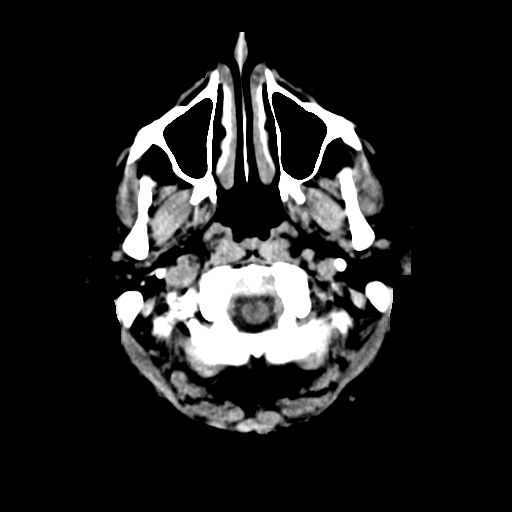
[im 3/31  bone]
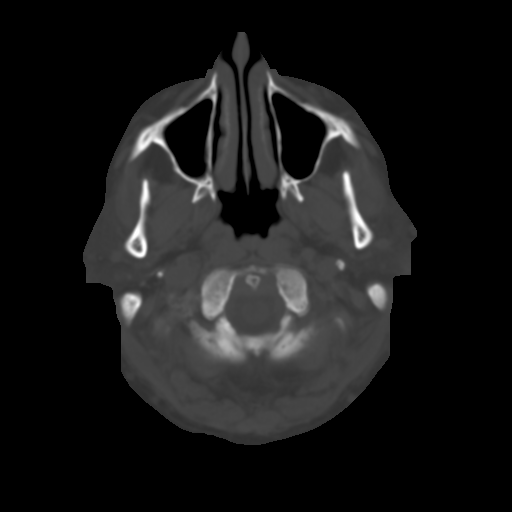
[im 7/31  brain]
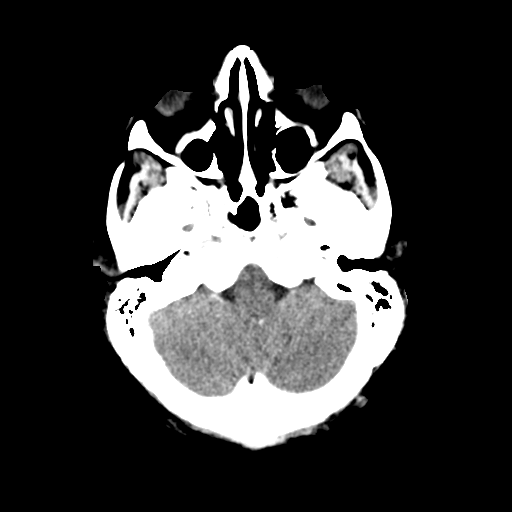
[im 10/31  brain]
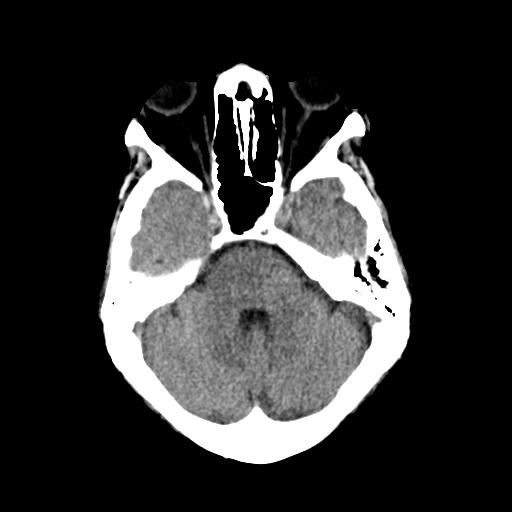
[im 14/31  brain]
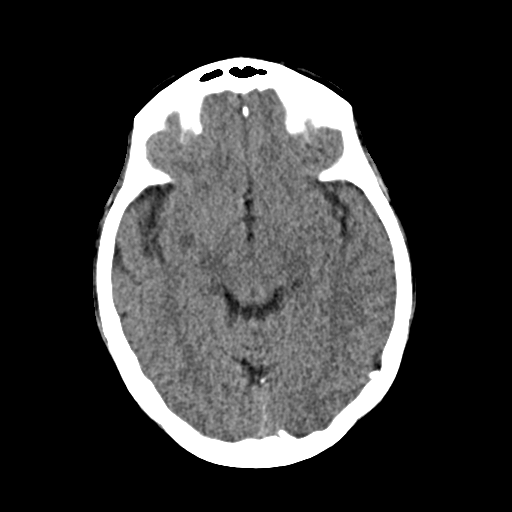
[im 17/31  brain]
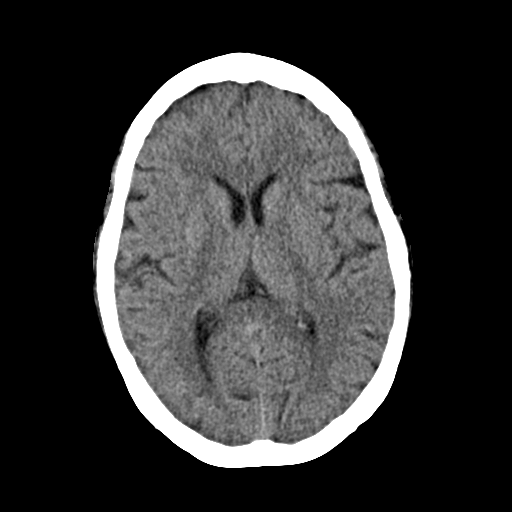
[im 17/31  bone]
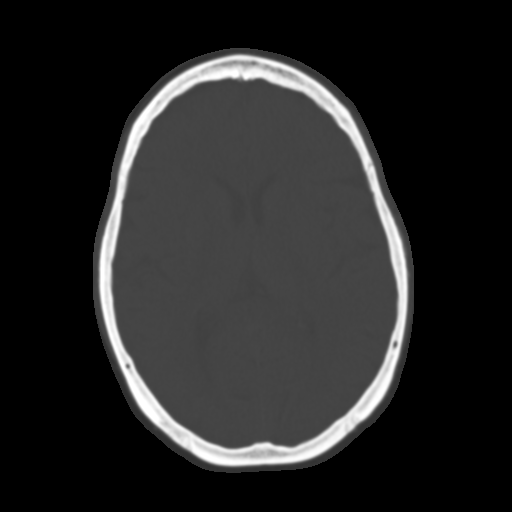
[im 21/31  brain]
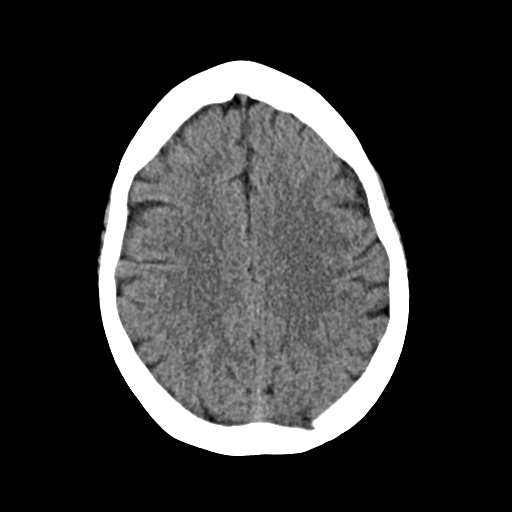
[im 24/31  brain]
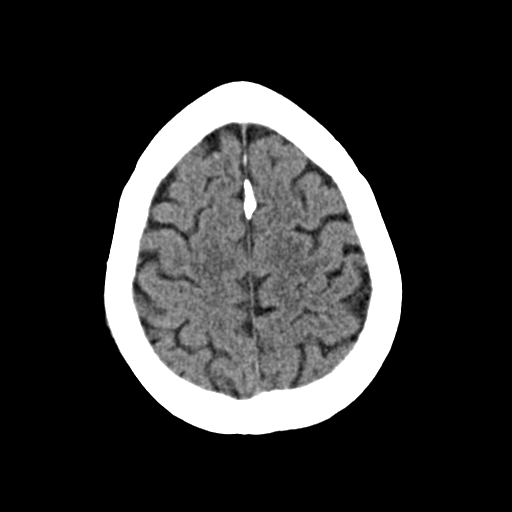
[im 28/31  brain]
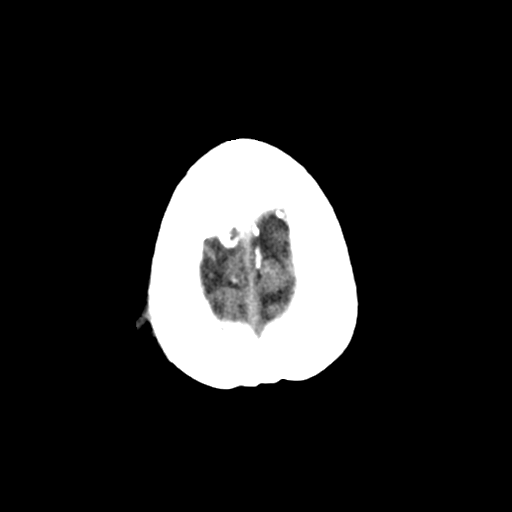

[Series 4: head 3.0 mpr cor · coronal · 0.30mm/px · 3 of 67 slices shown]
[im 23/67  brain]
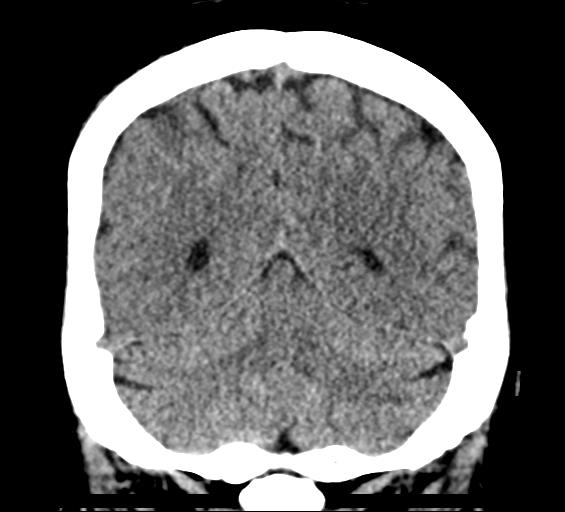
[im 30/67  brain]
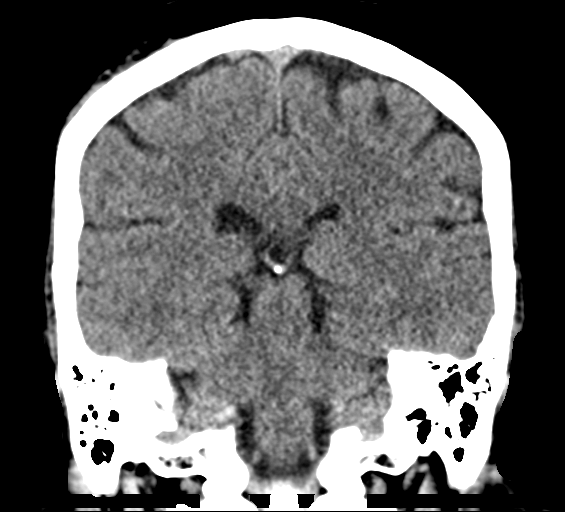
[im 37/67  brain]
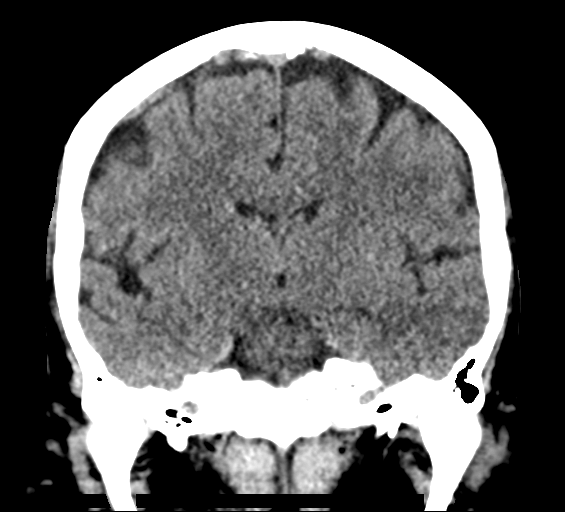

[Series 5: head 3.0 mpr sag · sagittal · 0.30mm/px · 3 of 56 slices shown]
[im 19/56  brain]
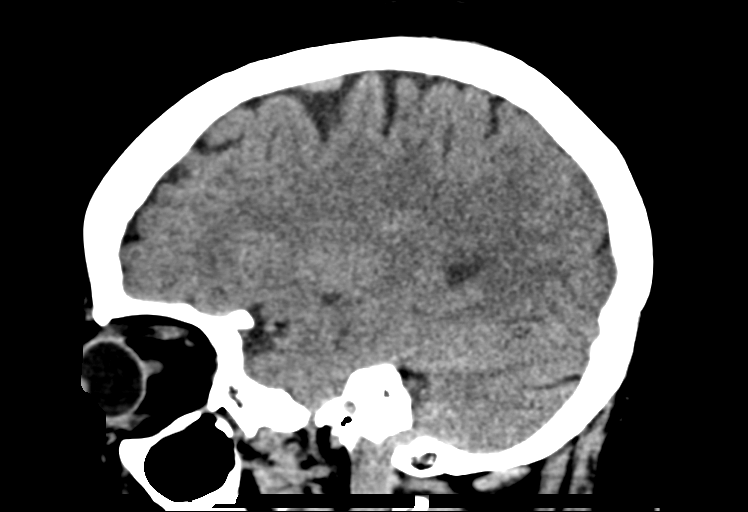
[im 28/56  brain]
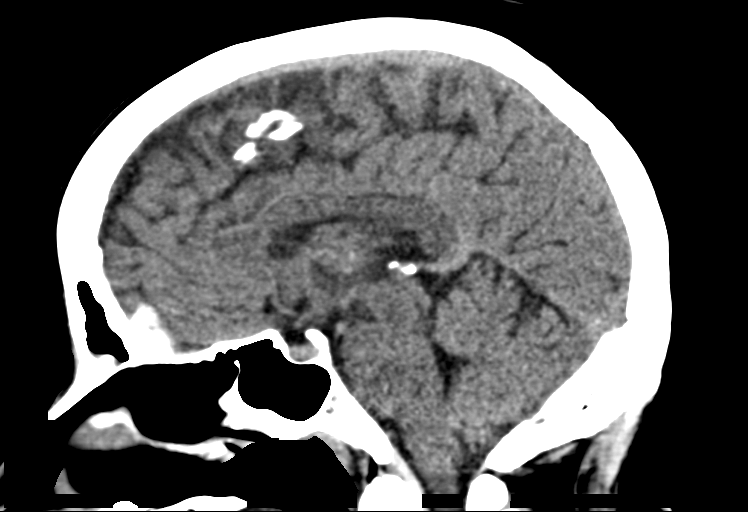
[im 37/56  brain]
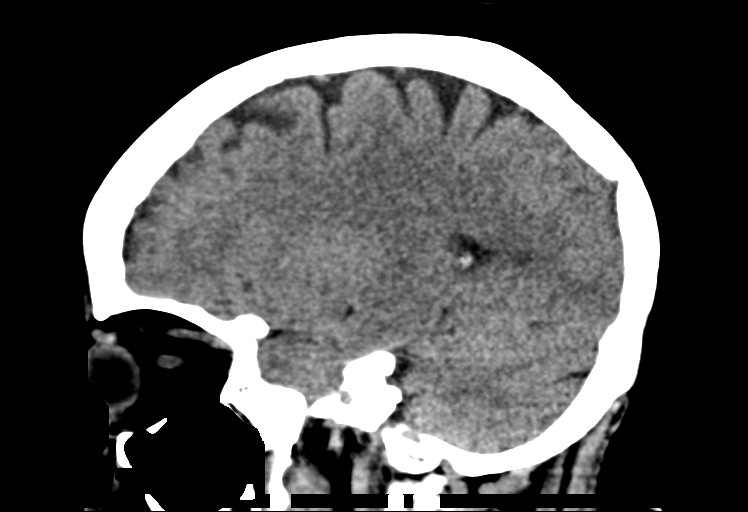

[14 of 47 positions shown; findings below may reference images not displayed]

FINDINGS: CT HEAD FINDINGS

Brain:

No evidence of large-territorial acute infarction. No parenchymal
hemorrhage. No mass lesion. No extra-axial collection.

No mass effect or midline shift. No hydrocephalus. Basilar cisterns
are patent.

Vascular: No hyperdense vessel.

Skull: No acute fracture or focal lesion.

Sinuses/Orbits: Paranasal sinuses and mastoid air cells are clear.
The orbits are unremarkable.

Other: None.

CT CERVICAL SPINE FINDINGS

Alignment: Normal.

Skull base and vertebrae: No acute fracture. No aggressive appearing
focal osseous lesion or focal pathologic process.

Soft tissues and spinal canal: No prevertebral fluid or swelling. No
visible canal hematoma.

Upper chest: Unremarkable.

Other: None.
IMPRESSION: 1. No acute intracranial abnormality.
2. No acute displaced fracture or traumatic listhesis of the
cervical spine.
